# Patient Record
Sex: Male | Born: 1980 | Race: Black or African American | Hispanic: No | Marital: Married | Smoking: Never smoker
Health system: Southern US, Community
[De-identification: ages and names within clinical notes are randomized; demographics above are authoritative.]

## PROBLEM LIST (undated history)

## (undated) DIAGNOSIS — K37 Unspecified appendicitis: Secondary | ICD-10-CM

## (undated) HISTORY — PX: SKIN GRAFT: SHX250

---

## 2010-01-12 ENCOUNTER — Emergency Department (HOSPITAL_BASED_OUTPATIENT_CLINIC_OR_DEPARTMENT_OTHER): Admission: EM | Admit: 2010-01-12 | Discharge: 2010-01-12 | Payer: Self-pay | Admitting: Emergency Medicine

## 2010-01-12 ENCOUNTER — Ambulatory Visit: Payer: Self-pay | Admitting: Diagnostic Radiology

## 2018-08-28 ENCOUNTER — Emergency Department (HOSPITAL_BASED_OUTPATIENT_CLINIC_OR_DEPARTMENT_OTHER)
Admission: EM | Admit: 2018-08-28 | Discharge: 2018-08-29 | Disposition: A | Discharge status: Home / Self Care | Payer: BC Managed Care – PPO | Attending: Emergency Medicine | Admitting: Emergency Medicine

## 2018-08-28 ENCOUNTER — Encounter (HOSPITAL_BASED_OUTPATIENT_CLINIC_OR_DEPARTMENT_OTHER): Payer: Self-pay | Admitting: Emergency Medicine

## 2018-08-28 ENCOUNTER — Other Ambulatory Visit: Payer: Self-pay

## 2018-08-28 DIAGNOSIS — R509 Fever, unspecified: Secondary | ICD-10-CM | POA: Insufficient documentation

## 2018-08-28 DIAGNOSIS — R112 Nausea with vomiting, unspecified: Secondary | ICD-10-CM | POA: Insufficient documentation

## 2018-08-28 DIAGNOSIS — R1013 Epigastric pain: Secondary | ICD-10-CM | POA: Diagnosis present

## 2018-08-28 HISTORY — DX: Unspecified appendicitis: K37

## 2018-08-28 NOTE — ED Triage Notes (Signed)
Intermittent abd pain and nausea x1 week. Hx appendicitis. Denies diarrhea, reports fevers intermittently TMAX 100.8 on Thursday.

## 2018-08-29 LAB — CBC WITH DIFFERENTIAL/PLATELET
Abs Immature Granulocytes: 0.01 10*3/uL (ref 0.00–0.07)
Basophils Absolute: 0 10*3/uL (ref 0.0–0.1)
Basophils Relative: 0 %
Eosinophils Absolute: 0 10*3/uL (ref 0.0–0.5)
Eosinophils Relative: 1 %
HCT: 40.8 % (ref 39.0–52.0)
Hemoglobin: 13.8 g/dL (ref 13.0–17.0)
Immature Granulocytes: 0 %
Lymphocytes Relative: 35 %
Lymphs Abs: 2 10*3/uL (ref 0.7–4.0)
MCH: 33.3 pg (ref 26.0–34.0)
MCHC: 33.8 g/dL (ref 30.0–36.0)
MCV: 98.6 fL (ref 80.0–100.0)
Monocytes Absolute: 0.5 10*3/uL (ref 0.1–1.0)
Monocytes Relative: 10 %
Neutro Abs: 3 10*3/uL (ref 1.7–7.7)
Neutrophils Relative %: 54 %
Platelets: 191 10*3/uL (ref 150–400)
RBC: 4.14 MIL/uL — ABNORMAL LOW (ref 4.22–5.81)
RDW: 12.8 % (ref 11.5–15.5)
WBC: 5.6 10*3/uL (ref 4.0–10.5)
nRBC: 0 % (ref 0.0–0.2)

## 2018-08-29 LAB — COMPREHENSIVE METABOLIC PANEL
ALT: 25 U/L (ref 0–44)
AST: 26 U/L (ref 15–41)
Albumin: 3.9 g/dL (ref 3.5–5.0)
Alkaline Phosphatase: 45 U/L (ref 38–126)
Anion gap: 8 (ref 5–15)
BUN: 13 mg/dL (ref 6–20)
CO2: 21 mmol/L — ABNORMAL LOW (ref 22–32)
Calcium: 8.8 mg/dL — ABNORMAL LOW (ref 8.9–10.3)
Chloride: 110 mmol/L (ref 98–111)
Creatinine, Ser: 1.19 mg/dL (ref 0.61–1.24)
GFR calc Af Amer: >60 mL/min (ref >60)
GFR calc non Af Amer: >60 mL/min (ref >60)
Glucose, Bld: 95 mg/dL (ref 70–99)
Potassium: 3.5 mmol/L (ref 3.5–5.1)
Sodium: 139 mmol/L (ref 135–145)
Total Bilirubin: 0.7 mg/dL (ref 0.3–1.2)
Total Protein: 7.3 g/dL (ref 6.5–8.1)

## 2018-08-29 LAB — URINALYSIS, ROUTINE W REFLEX MICROSCOPIC
Bilirubin Urine: NEGATIVE
Glucose, UA: NEGATIVE mg/dL
Hgb urine dipstick: NEGATIVE
Ketones, ur: NEGATIVE mg/dL
Leukocytes,Ua: NEGATIVE
Nitrite: NEGATIVE
Protein, ur: NEGATIVE mg/dL
Specific Gravity, Urine: >1.03 — ABNORMAL HIGH (ref 1.005–1.030)
pH: 6 (ref 5.0–8.0)

## 2018-08-29 LAB — LIPASE, BLOOD: Lipase: 26 U/L (ref 11–51)

## 2018-08-29 MED ORDER — OMEPRAZOLE 20 MG PO CPDR: 20.0000 mg | DELAYED_RELEASE_CAPSULE | Freq: Every day | ORAL | 0 refills | Status: DC

## 2018-08-29 MED ORDER — SODIUM CHLORIDE 0.9 % IV BOLUS
1000.0000 mL | Freq: Once | INTRAVENOUS | Status: AC
  Administered 2018-08-29: 1000 mL via INTRAVENOUS

## 2018-08-29 MED ORDER — PANTOPRAZOLE SODIUM 40 MG PO TBEC
40.0000 mg | DELAYED_RELEASE_TABLET | Freq: Every day | ORAL | Status: DC
  Administered 2018-08-29: 40 mg via ORAL
  Filled 2018-08-29: qty 1

## 2018-08-29 MED ORDER — MORPHINE SULFATE (PF) 4 MG/ML IV SOLN
4.0000 mg | Freq: Once | INTRAVENOUS | Status: AC
  Administered 2018-08-29: 4 mg via INTRAVENOUS
  Filled 2018-08-29: qty 1

## 2018-08-29 MED ORDER — ONDANSETRON HCL 4 MG/2ML IJ SOLN
4.0000 mg | Freq: Once | INTRAMUSCULAR | Status: AC
  Administered 2018-08-29: 4 mg via INTRAVENOUS
  Filled 2018-08-29: qty 2

## 2018-08-29 NOTE — ED Provider Notes (Signed)
[facility] EMERGENCY DEPARTMENT Provider Note   CSN: [id number] Arrival date & time: 08/28/18  2259    History   Chief Complaint Chief Complaint  Patient presents with  . Abdominal Pain    HPI Christopher Velasquez is a 38 y.o. male.     HPI  This is a 38 year old male who presents with abdominal pain and nausea and vomiting.  Patient reports that he has had 1 week history of upper abdominal pain that he describes as dull and nonradiating.  It comes and goes.  He states that pain is occasionally relieved with vomiting.  He does not associate pain specifically with eating.  He reports that often times he also has hiccups.  Currently his pain is 6 out of 10 and has become more persistent.  He states that he had a fever of 100.8 on Thursday.  Denies any diarrhea.  Denies any known sick contacts.  Reports that he had "an inflamed appendix" several years ago but did not have surgery.  Patient has not taken anything for his pain.  Past Medical History:  Diagnosis Date  . Appendicitis     There are no active problems to display for this patient.   Past Surgical History:  Procedure Laterality Date  . SKIN GRAFT          Home Medications    Prior to Admission medications   Medication Sig Start Date End Date Taking? Authorizing Provider  omeprazole (PRILOSEC) 20 MG capsule Take 1 capsule (20 mg total) by mouth daily. 08/29/18   Giancarlos Berendt, Mayer Maskerourtney F, MD    Family History No family history on file.  Social History Social History   Tobacco Use  . Smoking status: Never Smoker  . Smokeless tobacco: Never Used  Substance Use Topics  . Alcohol use: Yes    Comment: socially  . Drug use: Never     Allergies   Patient has no known allergies.   Review of Systems Review of Systems  Constitutional: Positive for fever.  Respiratory: Negative for shortness of breath.   Cardiovascular: Negative for chest pain.  Gastrointestinal: Positive for abdominal pain, nausea and  vomiting. Negative for diarrhea.  Genitourinary: Negative for dysuria.  All other systems reviewed and are negative.    Physical Exam Updated Vital Signs BP 134/71   Pulse (!) 109   Temp 98.4 F (36.9 C) (Oral)   Resp 18   Ht 1.803 m (5\' 11" )   Wt 86.2 kg   SpO2 100%   BMI 26.50 kg/m   Physical Exam Vitals signs and nursing note reviewed.  Constitutional:      General: He is not in acute distress.    Appearance: He is well-developed. He is not ill-appearing.  HENT:     Head: Normocephalic and atraumatic.  Neck:     Musculoskeletal: Neck Velasquez.  Cardiovascular:     Rate and Rhythm: Normal rate and regular rhythm.     Heart sounds: Normal heart sounds. No murmur.  Pulmonary:     Effort: Pulmonary effort is normal. No respiratory distress.     Breath sounds: Normal breath sounds. No wheezing.  Abdominal:     General: Bowel sounds are normal.     Palpations: Abdomen is soft.     Tenderness: There is abdominal tenderness in the right upper quadrant and epigastric area. There is no guarding or rebound.  Lymphadenopathy:     Cervical: No cervical adenopathy.  Skin:    General: Skin is warm and  dry.  Neurological:     Mental Status: He is alert and oriented to person, place, and time.  Psychiatric:        Mood and Affect: Mood normal.      ED Treatments / Results  Labs (all labs ordered are listed, but only abnormal results are displayed) Labs Reviewed  CBC WITH DIFFERENTIAL/PLATELET - Abnormal; Notable for the following components:      Result Value   RBC 4.14 (*)    All other components within normal limits  URINALYSIS, ROUTINE W REFLEX MICROSCOPIC - Abnormal; Notable for the following components:   Specific Gravity, Urine >1.030 (*)    All other components within normal limits  COMPREHENSIVE METABOLIC PANEL - Abnormal; Notable for the following components:   CO2 21 (*)    Calcium 8.8 (*)    All other components within normal limits  LIPASE, BLOOD    EKG  None  Radiology No results found.  Procedures Procedures (including critical care time)  Medications Ordered in ED Medications  pantoprazole (PROTONIX) EC tablet 40 mg (40 mg Oral Given 08/29/18 0110)  sodium chloride 0.9 % bolus 1,000 mL (0 mLs Intravenous Stopped 08/29/18 0129)  morphine 4 MG/ML injection 4 mg (4 mg Intravenous Given 08/29/18 0026)  ondansetron (ZOFRAN) injection 4 mg (4 mg Intravenous Given 08/29/18 0025)     Initial Impression / Assessment and Plan / ED Course  I have reviewed the triage vital signs and the nursing notes.  Pertinent labs & imaging results that were available during my care of the patient were reviewed by me and considered in my medical decision making (see chart for details).       Patient presents with 1 week of abdominal pain.  He is overall nontoxic-appearing and vital signs are reassuring.  He has some tenderness without signs of peritonitis on exam.  Given location, considerations include pancreatitis, gastritis, gastroenteritis, reflux, cholecystitis, less likely appendicitis.  He was given pain and nausea medication.  Lab work obtained.  No significant leukocytosis.  LFTs and lipase are reassuring.  Patient much improved after Protonix.  Suspect he may have an element of reflux given the recurrence of symptoms and associated vomiting.  Will discharge home with omeprazole and GI follow-up.  After history, exam, and medical workup I feel the patient has been appropriately medically screened and is safe for discharge home. Pertinent diagnoses were discussed with the patient. Patient was given return precautions.  Final Clinical Impressions(s) / ED Diagnoses   Final diagnoses:  Epigastric pain    ED Discharge Orders         Ordered    omeprazole (PRILOSEC) 20 MG capsule  Daily     08/29/18 0137           Merryl Hacker, MD 08/29/18 0140

## 2018-08-29 NOTE — Discharge Instructions (Addendum)
You were seen today for abdominal pain.  This may be related to reflux.  Your work-up was otherwise reassuring.  Follow-up closely with your primary physician.  If you have ongoing issues, you can follow-up with GI.  Information provided in discharge.

## 2019-08-19 ENCOUNTER — Encounter (HOSPITAL_BASED_OUTPATIENT_CLINIC_OR_DEPARTMENT_OTHER): Payer: Self-pay | Admitting: Emergency Medicine

## 2019-08-19 ENCOUNTER — Emergency Department (HOSPITAL_BASED_OUTPATIENT_CLINIC_OR_DEPARTMENT_OTHER)
Admission: EM | Admit: 2019-08-19 | Discharge: 2019-08-19 | Disposition: A | Discharge status: Home / Self Care | Payer: BC Managed Care – PPO | Attending: Emergency Medicine | Admitting: Emergency Medicine

## 2019-08-19 ENCOUNTER — Emergency Department (HOSPITAL_BASED_OUTPATIENT_CLINIC_OR_DEPARTMENT_OTHER): Payer: BC Managed Care – PPO

## 2019-08-19 ENCOUNTER — Other Ambulatory Visit: Payer: Self-pay

## 2019-08-19 DIAGNOSIS — Y939 Activity, unspecified: Secondary | ICD-10-CM | POA: Insufficient documentation

## 2019-08-19 DIAGNOSIS — Z23 Encounter for immunization: Secondary | ICD-10-CM | POA: Insufficient documentation

## 2019-08-19 DIAGNOSIS — Y929 Unspecified place or not applicable: Secondary | ICD-10-CM | POA: Insufficient documentation

## 2019-08-19 DIAGNOSIS — Z79899 Other long term (current) drug therapy: Secondary | ICD-10-CM | POA: Insufficient documentation

## 2019-08-19 DIAGNOSIS — W208XXA Other cause of strike by thrown, projected or falling object, initial encounter: Secondary | ICD-10-CM | POA: Insufficient documentation

## 2019-08-19 DIAGNOSIS — S97121A Crushing injury of right lesser toe(s), initial encounter: Secondary | ICD-10-CM

## 2019-08-19 DIAGNOSIS — Y999 Unspecified external cause status: Secondary | ICD-10-CM | POA: Insufficient documentation

## 2019-08-19 DIAGNOSIS — S90414A Abrasion, right lesser toe(s), initial encounter: Secondary | ICD-10-CM | POA: Insufficient documentation

## 2019-08-19 MED ORDER — NAPROXEN 375 MG PO TABS: ORAL_TABLET | ORAL | 0 refills | Status: AC

## 2019-08-19 MED ORDER — TETANUS-DIPHTH-ACELL PERTUSSIS 5-2.5-18.5 LF-MCG/0.5 IM SUSP
0.5000 mL | Freq: Once | INTRAMUSCULAR | Status: AC
  Administered 2019-08-19: 0.5 mL via INTRAMUSCULAR
  Filled 2019-08-19: qty 0.5

## 2019-08-19 MED ORDER — NAPROXEN 250 MG PO TABS
500.0000 mg | ORAL_TABLET | Freq: Once | ORAL | Status: AC
  Administered 2019-08-19: 500 mg via ORAL
  Filled 2019-08-19: qty 2

## 2019-08-19 NOTE — ED Triage Notes (Signed)
Patient presents with complaints of right foot pain, swelling and laceration; states a TV fell onto his foot this evening. Ambulatory with minimal difficulty.

## 2019-08-19 NOTE — ED Provider Notes (Signed)
[facility] Provider Note: Christopher Dell, MD, FACEP  CSN: [id number] MRN: [id number] ARRIVAL: 08/19/19 at 0118 ROOM: [facility]02/[facility]   CHIEF COMPLAINT  Foot Injury   HISTORY OF PRESENT ILLNESS  08/19/19 4:10 AM Christopher Velasquez is a 39 y.o. male who had a heavy (CRT type) TV fall onto his right foot just prior to arrival.  He has pain, swelling and an abrasion to his right fifth toe.  He rates his pain is a 6 out of 10, throbbing in nature, worse with movement or palpation.  He is not sure of his last tetanus shot.   Past Medical History:  Diagnosis Date  . Appendicitis     Past Surgical History:  Procedure Laterality Date  . SKIN GRAFT      History reviewed. No pertinent family history.  Social History   Tobacco Use  . Smoking status: Never Smoker  . Smokeless tobacco: Never Used  Substance Use Topics  . Alcohol use: Yes    Comment: socially  . Drug use: Never    Prior to Admission medications   Medication Sig Start Date End Date Taking? Authorizing Provider  omeprazole (PRILOSEC) 20 MG capsule Take 1 capsule (20 mg total) by mouth daily. 08/29/18 08/19/19  Horton, Mayer Masker, MD    Allergies Patient has no known allergies.   REVIEW OF SYSTEMS  Negative except as noted here or in the History of Present Illness.   PHYSICAL EXAMINATION  Initial Vital Signs Blood pressure 128/74, pulse 77, temperature 97.6 F (36.4 C), temperature source Oral, resp. rate 18, height 5\' 11"  (1.803 m), weight 86 kg, SpO2 98 %.  Examination General: Well-developed, well-nourished male in no acute distress; appearance consistent with age of record HENT: normocephalic; atraumatic Eyes: Normal appearance Neck: supple Heart: regular rate and rhythm Lungs: clear to auscultation bilaterally Abdomen: soft; nondistended; nontender; bowel sounds present Extremities: No deformity; tenderness and mild swelling of right fifth toe with abrasion overlying the fifth MTP  joint Neurologic: Awake, alert and oriented; motor function intact in all extremities and symmetric; no facial droop Skin: Warm and dry Psychiatric: Normal mood and affect   RESULTS  Summary of this visit's results, reviewed and interpreted by myself:   EKG Interpretation  Date/Time:    Ventricular Rate:    PR Interval:    QRS Duration:   QT Interval:    QTC Calculation:   R Axis:     Text Interpretation:        Laboratory Studies: No results found for this or any previous visit (from the past 24 hour(s)). Imaging Studies: DG Foot Complete Right  Result Date: 08/19/2019 CLINICAL DATA:  TB fell on foot EXAM: RIGHT FOOT COMPLETE - 3+ VIEW COMPARISON:  None. FINDINGS: There is no evidence of fracture or dislocation. There is no evidence of arthropathy or other focal bone abnormality. Mild dorsal soft tissue swelling. IMPRESSION: No acute osseous abnormality. Electronically Signed   By: 08/21/2019 M.D.   On: 08/19/2019 02:00    ED COURSE and MDM  Nursing notes, initial and subsequent vitals signs, including pulse oximetry, reviewed and interpreted by myself.  Vitals:   08/19/19 0128 08/19/19 0130  BP:  128/74  Pulse:  77  Resp:  18  Temp:  97.6 F (36.4 C)  TempSrc:  Oral  SpO2:  98%  Weight: 86 kg   Height: 5\' 11"  (1.803 m)    Medications  Tdap (BOOSTRIX) injection 0.5 mL (has no administration in time range)  No evidence of fracture on radiograph.  Tetanus updated.  PROCEDURES  Procedures   ED DIAGNOSES     ICD-10-CM   1. Crushing injury of fifth toe of right foot, initial encounter  S97.121A   2. Abrasion of fifth toe of right foot, initial encounter  S90.414A        Nikalas Bramel, Jenny Reichmann, MD 08/19/19 [phone]

## 2020-09-28 IMAGING — CR DG FOOT COMPLETE 3+V*R*
3 series · 3 of 3 positions shown · non-contrast
Comparison: None.

CLINICAL DATA: TB fell on foot

EXAM:
RIGHT FOOT COMPLETE - 3+ VIEW

[t foot ap right]
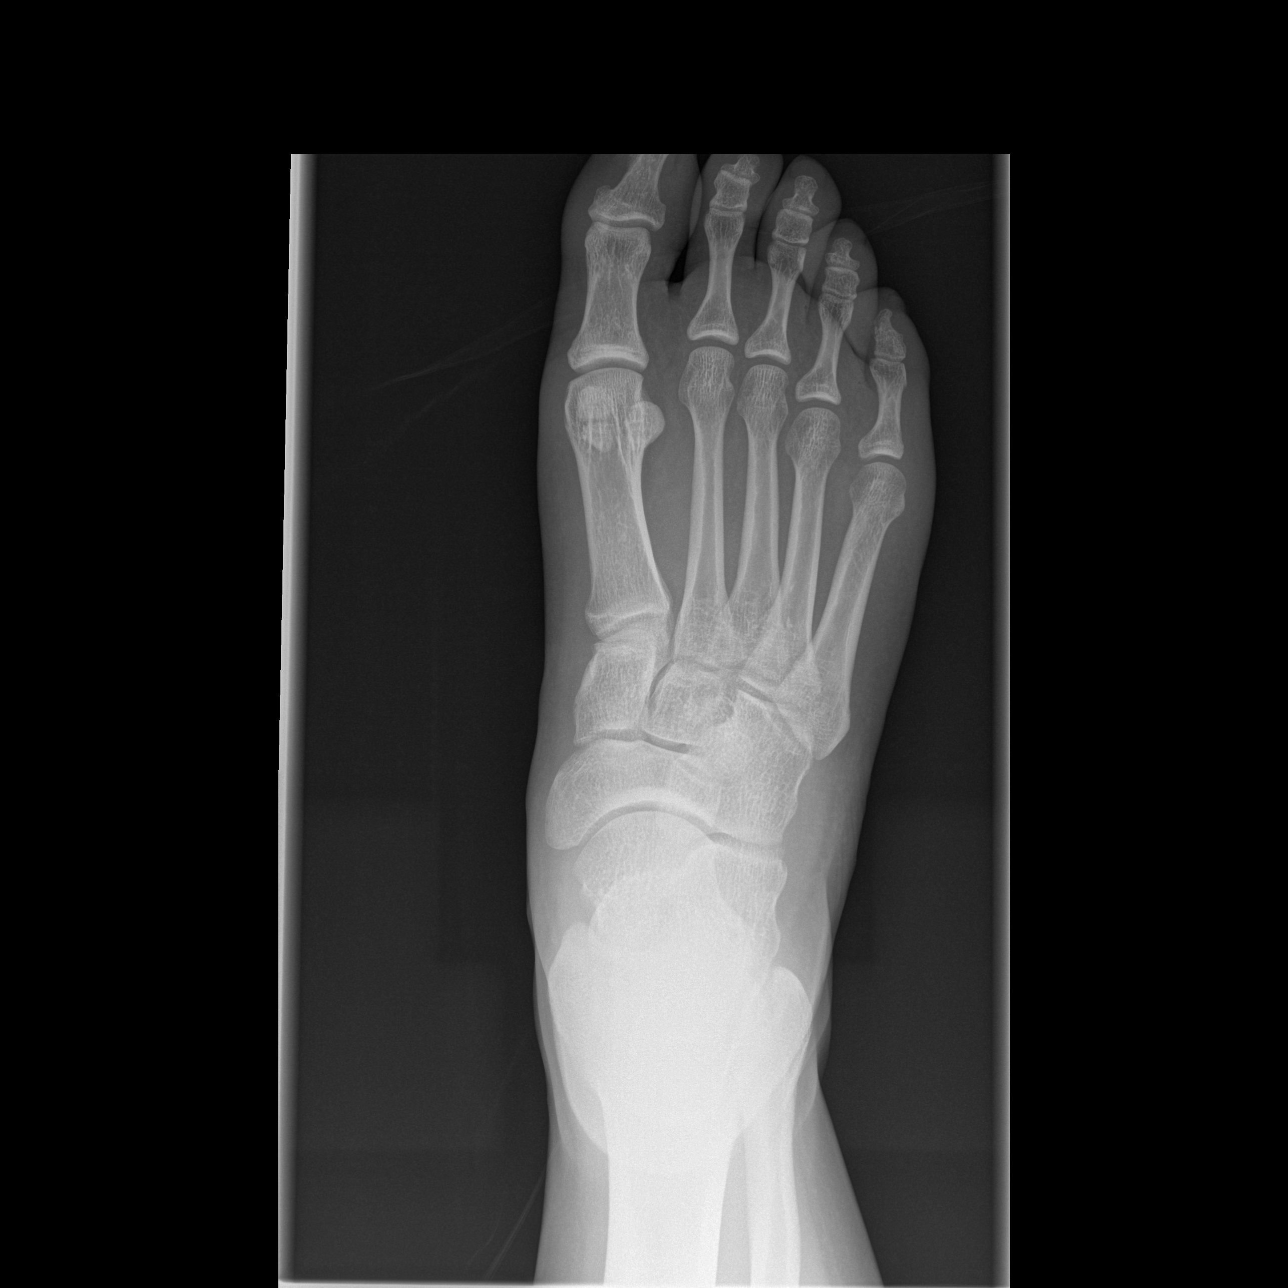

[t foot oblique right]
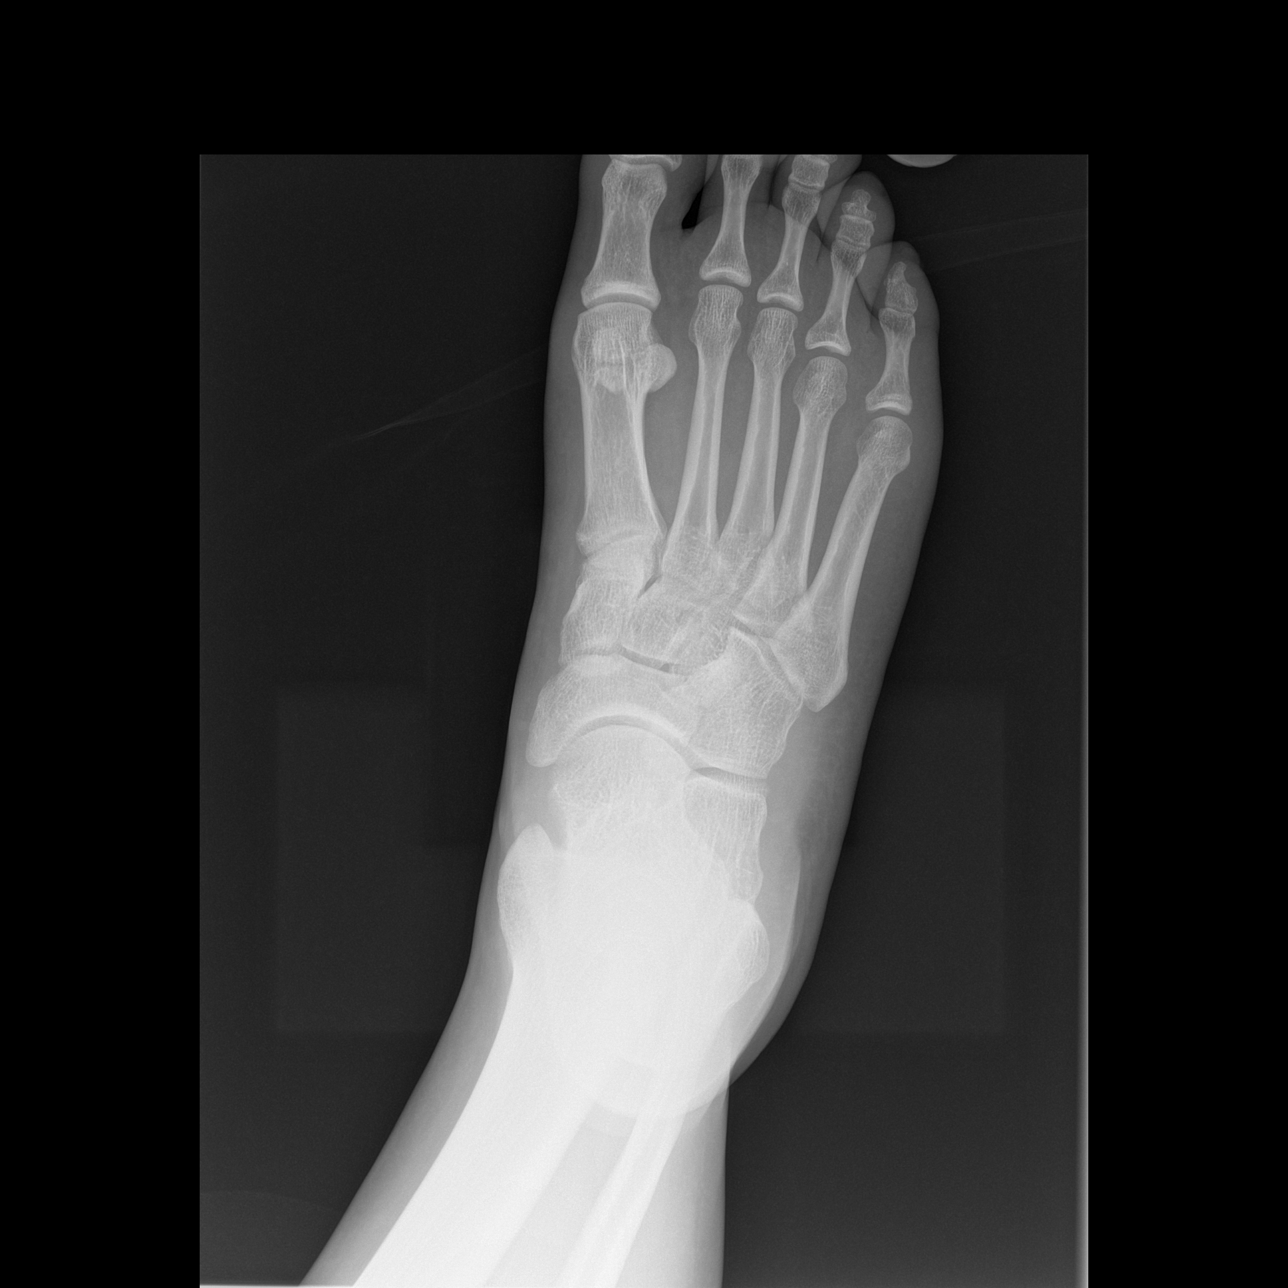

[t foot lat right]
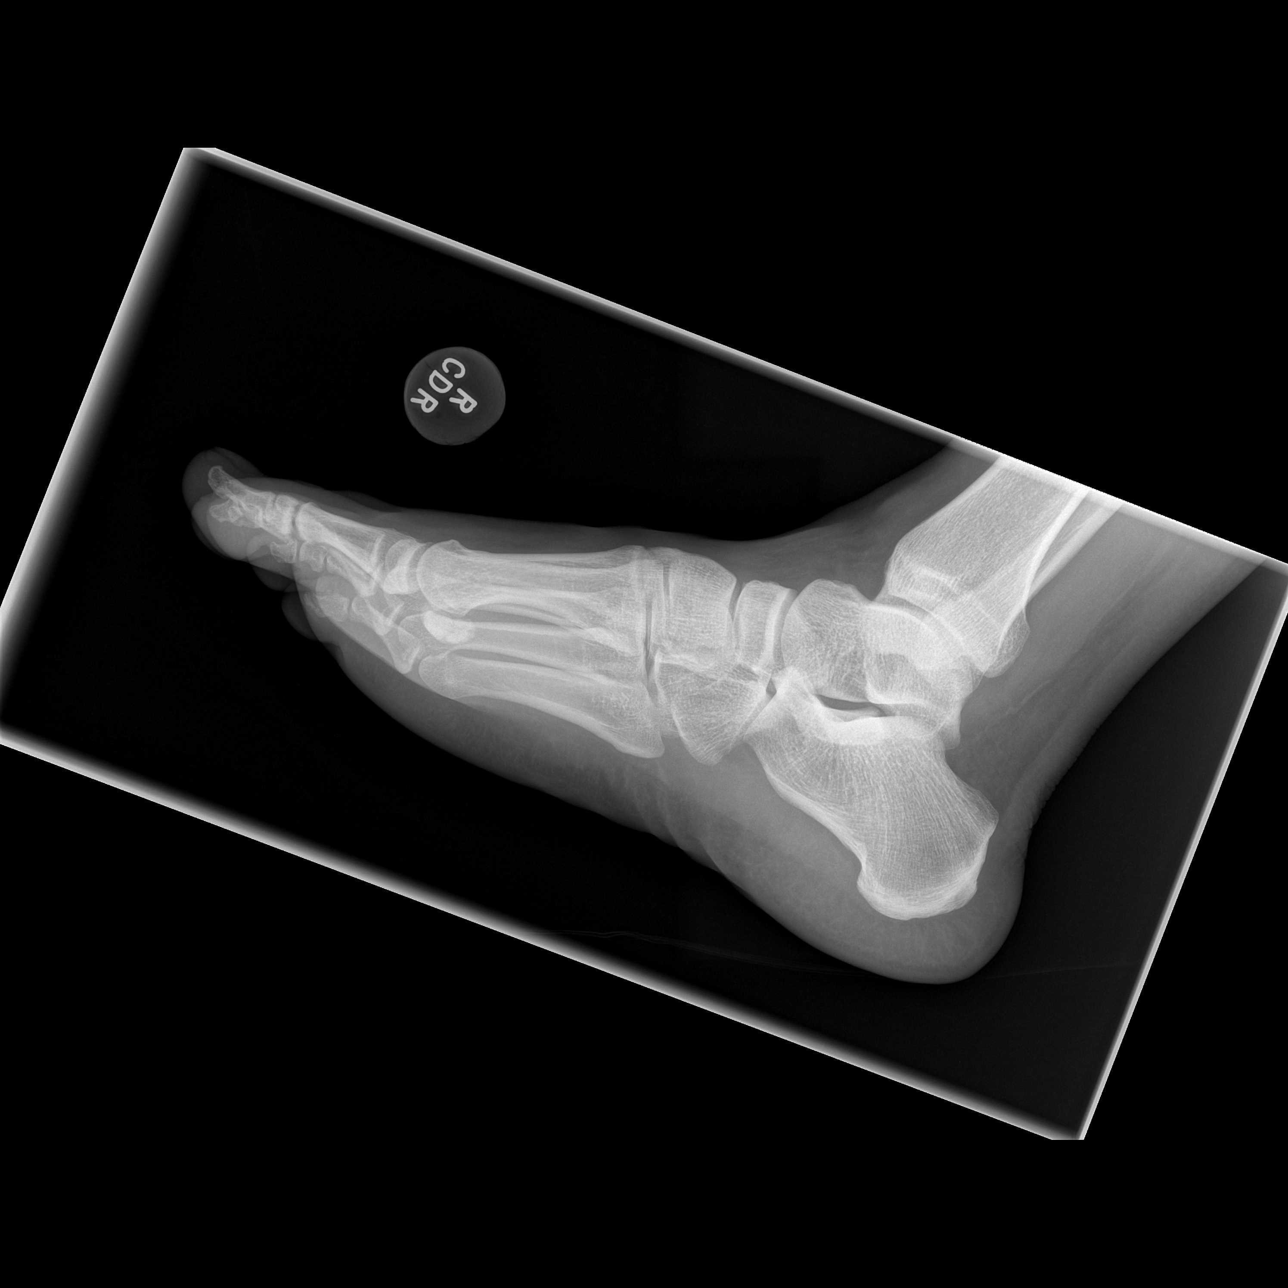

[3 of 3 positions shown; findings below may reference images not displayed]

FINDINGS: There is no evidence of fracture or dislocation. There is no
evidence of arthropathy or other focal bone abnormality. Mild dorsal
soft tissue swelling.
IMPRESSION: No acute osseous abnormality.
